# Patient Record
Sex: Male | Born: 1960 | Race: Black or African American | Hispanic: No | Marital: Single | Smoking: Current every day smoker
Health system: Southern US, Community
[De-identification: ages and names within clinical notes are randomized; demographics above are authoritative.]

## PROBLEM LIST (undated history)

## (undated) HISTORY — PX: KNEE ARTHROSCOPY WITH ANTERIOR CRUCIATE LIGAMENT (ACL) REPAIR: SHX5644

## (undated) HISTORY — PX: SHOULDER ARTHROSCOPY W/ ROTATOR CUFF REPAIR: SHX2400

---

## 1999-02-13 ENCOUNTER — Emergency Department (HOSPITAL_COMMUNITY): Admission: EM | Admit: 1999-02-13 | Discharge: 1999-02-13 | Payer: Self-pay | Admitting: Emergency Medicine

## 1999-02-13 ENCOUNTER — Encounter: Payer: Self-pay | Admitting: Emergency Medicine

## 1999-02-19 ENCOUNTER — Emergency Department (HOSPITAL_COMMUNITY): Admission: EM | Admit: 1999-02-19 | Discharge: 1999-02-19 | Payer: Self-pay | Admitting: Emergency Medicine

## 1999-11-03 ENCOUNTER — Emergency Department (HOSPITAL_COMMUNITY): Admission: EM | Admit: 1999-11-03 | Discharge: 1999-11-03 | Payer: Self-pay | Admitting: *Deleted

## 1999-11-09 ENCOUNTER — Emergency Department (HOSPITAL_COMMUNITY): Admission: EM | Admit: 1999-11-09 | Discharge: 1999-11-09 | Payer: Self-pay | Admitting: Emergency Medicine

## 2001-05-11 ENCOUNTER — Ambulatory Visit (HOSPITAL_COMMUNITY): Admission: RE | Admit: 2001-05-11 | Discharge: 2001-05-11 | Payer: Self-pay | Admitting: Family Medicine

## 2001-05-11 ENCOUNTER — Encounter: Payer: Self-pay | Admitting: Family Medicine

## 2002-04-04 ENCOUNTER — Emergency Department (HOSPITAL_COMMUNITY): Admission: EM | Admit: 2002-04-04 | Discharge: 2002-04-04 | Payer: Self-pay

## 2002-04-04 ENCOUNTER — Encounter: Payer: Self-pay | Admitting: Emergency Medicine

## 2004-02-28 ENCOUNTER — Inpatient Hospital Stay (HOSPITAL_COMMUNITY): Admission: EM | Admit: 2004-02-28 | Discharge: 2004-03-04 | Payer: Self-pay | Admitting: Emergency Medicine

## 2004-03-06 ENCOUNTER — Inpatient Hospital Stay (HOSPITAL_COMMUNITY): Admission: EM | Admit: 2004-03-06 | Discharge: 2004-03-16 | Payer: Self-pay | Admitting: Emergency Medicine

## 2004-03-24 ENCOUNTER — Inpatient Hospital Stay (HOSPITAL_COMMUNITY): Admission: EM | Admit: 2004-03-24 | Discharge: 2004-03-27 | Payer: Self-pay | Admitting: *Deleted

## 2004-08-25 ENCOUNTER — Emergency Department (HOSPITAL_COMMUNITY): Admission: EM | Admit: 2004-08-25 | Discharge: 2004-08-26 | Payer: Self-pay | Admitting: Emergency Medicine

## 2004-08-26 ENCOUNTER — Emergency Department (HOSPITAL_COMMUNITY): Admission: EM | Admit: 2004-08-26 | Discharge: 2004-08-26 | Payer: Self-pay | Admitting: Emergency Medicine

## 2005-07-10 ENCOUNTER — Ambulatory Visit: Payer: Self-pay | Admitting: *Deleted
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
## 2014-01-07 ENCOUNTER — Emergency Department (HOSPITAL_COMMUNITY): Payer: Self-pay

## 2014-01-07 ENCOUNTER — Encounter (HOSPITAL_COMMUNITY): Payer: Self-pay | Admitting: Emergency Medicine

## 2014-01-07 ENCOUNTER — Emergency Department (HOSPITAL_COMMUNITY)
Admission: EM | Admit: 2014-01-07 | Discharge: 2014-01-07 | Disposition: A | Discharge status: Home / Self Care | Payer: Self-pay | Attending: Emergency Medicine | Admitting: Emergency Medicine

## 2014-01-07 DIAGNOSIS — Z23 Encounter for immunization: Secondary | ICD-10-CM | POA: Insufficient documentation

## 2014-01-07 DIAGNOSIS — S8012XA Contusion of left lower leg, initial encounter: Secondary | ICD-10-CM

## 2014-01-07 DIAGNOSIS — F17200 Nicotine dependence, unspecified, uncomplicated: Secondary | ICD-10-CM | POA: Insufficient documentation

## 2014-01-07 DIAGNOSIS — R51 Headache: Secondary | ICD-10-CM

## 2014-01-07 DIAGNOSIS — S01111A Laceration without foreign body of right eyelid and periocular area, initial encounter: Secondary | ICD-10-CM

## 2014-01-07 DIAGNOSIS — S0180XA Unspecified open wound of other part of head, initial encounter: Secondary | ICD-10-CM | POA: Insufficient documentation

## 2014-01-07 DIAGNOSIS — S0990XA Unspecified injury of head, initial encounter: Secondary | ICD-10-CM | POA: Insufficient documentation

## 2014-01-07 DIAGNOSIS — S8010XA Contusion of unspecified lower leg, initial encounter: Secondary | ICD-10-CM | POA: Insufficient documentation

## 2014-01-07 DIAGNOSIS — R55 Syncope and collapse: Secondary | ICD-10-CM | POA: Insufficient documentation

## 2014-01-07 DIAGNOSIS — R519 Headache, unspecified: Secondary | ICD-10-CM

## 2014-01-07 MED ORDER — KETOROLAC TROMETHAMINE 30 MG/ML IJ SOLN
30.0000 mg | Freq: Once | INTRAMUSCULAR | Status: AC
  Administered 2014-01-07: 30 mg via INTRAVENOUS
  Filled 2014-01-07: qty 1

## 2014-01-07 MED ORDER — BACITRACIN ZINC 500 UNIT/GM EX OINT
2.0000 "application " | TOPICAL_OINTMENT | Freq: Two times a day (BID) | CUTANEOUS | Status: DC
  Administered 2014-01-07: 2 via TOPICAL

## 2014-01-07 MED ORDER — BACITRACIN ZINC 500 UNIT/GM EX OINT: 1.0000 "application " | TOPICAL_OINTMENT | Freq: Two times a day (BID) | CUTANEOUS | Status: AC

## 2014-01-07 MED ORDER — TETANUS-DIPHTH-ACELL PERTUSSIS 5-2.5-18.5 LF-MCG/0.5 IM SUSP
0.5000 mL | Freq: Once | INTRAMUSCULAR | Status: AC
  Administered 2014-01-07: 0.5 mL via INTRAMUSCULAR
  Filled 2014-01-07: qty 0.5

## 2014-01-07 MED ORDER — MORPHINE SULFATE 4 MG/ML IJ SOLN
4.0000 mg | Freq: Once | INTRAMUSCULAR | Status: AC
  Administered 2014-01-07: 4 mg via INTRAVENOUS
  Filled 2014-01-07: qty 1

## 2014-01-07 MED ORDER — OXYCODONE-ACETAMINOPHEN 5-325 MG PO TABS: 1.0000 | ORAL_TABLET | Freq: Four times a day (QID) | ORAL | Status: AC | PRN

## 2014-01-07 MED ORDER — LIDOCAINE-EPINEPHRINE (PF) 2 %-1:200000 IJ SOLN
10.0000 mL | Freq: Once | INTRAMUSCULAR | Status: AC
  Administered 2014-01-07: 10 mL
  Filled 2014-01-07: qty 10

## 2014-01-07 MED ORDER — OXYCODONE-ACETAMINOPHEN 5-325 MG PO TABS
2.0000 | ORAL_TABLET | Freq: Once | ORAL | Status: AC
  Administered 2014-01-07: 2 via ORAL
  Filled 2014-01-07: qty 2

## 2014-01-07 MED ORDER — METOCLOPRAMIDE HCL 5 MG/ML IJ SOLN: 10.0000 mg | Freq: Once | INTRAMUSCULAR | Status: DC

## 2014-01-07 MED ORDER — METOCLOPRAMIDE HCL 5 MG/ML IJ SOLN
10.0000 mg | Freq: Once | INTRAMUSCULAR | Status: AC
  Administered 2014-01-07: 10 mg via INTRAVENOUS
  Filled 2014-01-07: qty 2
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
## 2014-01-07 NOTE — Discharge Instructions (Signed)

## 2014-01-07 NOTE — ED Notes (Signed)
Patient stated he is walking.
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
## 2014-01-07 NOTE — ED Provider Notes (Signed)
Medical screening examination/treatment/procedure(s) were performed by non-physician practitioner and as supervising physician I was immediately available for consultation/collaboration.   EKG Interpretation None       Olivia Mackie, MD 01/07/14 [phone]

## 2015-08-13 DEATH — deceased
